# Patient Record
Sex: Female | Born: 1970 | Race: White | Hispanic: No | Marital: Married | Smoking: Never smoker
Health system: Southern US, Community
[De-identification: ages and names within clinical notes are randomized; demographics above are authoritative.]

## PROBLEM LIST (undated history)

## (undated) DIAGNOSIS — E538 Deficiency of other specified B group vitamins: Secondary | ICD-10-CM

## (undated) DIAGNOSIS — R51 Headache: Secondary | ICD-10-CM

## (undated) DIAGNOSIS — G459 Transient cerebral ischemic attack, unspecified: Secondary | ICD-10-CM

## (undated) DIAGNOSIS — Z9884 Bariatric surgery status: Secondary | ICD-10-CM

## (undated) DIAGNOSIS — E042 Nontoxic multinodular goiter: Secondary | ICD-10-CM

## (undated) DIAGNOSIS — IMO0001 Reserved for inherently not codable concepts without codable children: Secondary | ICD-10-CM

## (undated) DIAGNOSIS — D649 Anemia, unspecified: Secondary | ICD-10-CM

## (undated) DIAGNOSIS — I10 Essential (primary) hypertension: Secondary | ICD-10-CM

## (undated) DIAGNOSIS — I499 Cardiac arrhythmia, unspecified: Secondary | ICD-10-CM

## (undated) DIAGNOSIS — Z9889 Other specified postprocedural states: Secondary | ICD-10-CM

## (undated) DIAGNOSIS — R112 Nausea with vomiting, unspecified: Secondary | ICD-10-CM

## (undated) DIAGNOSIS — Z5189 Encounter for other specified aftercare: Secondary | ICD-10-CM

## (undated) DIAGNOSIS — R011 Cardiac murmur, unspecified: Secondary | ICD-10-CM

## (undated) DIAGNOSIS — M79673 Pain in unspecified foot: Secondary | ICD-10-CM

## (undated) HISTORY — DX: Pain in unspecified foot: M79.673

## (undated) HISTORY — PX: GASTRIC BYPASS: SHX52

## (undated) HISTORY — PX: CHOLECYSTECTOMY: SHX55

## (undated) HISTORY — PX: ABDOMINAL HYSTERECTOMY: SHX81

## (undated) HISTORY — PX: BREAST REDUCTION SURGERY: SHX8

## (undated) HISTORY — DX: Essential (primary) hypertension: I10

## (undated) HISTORY — DX: Deficiency of other specified B group vitamins: E53.8

## (undated) HISTORY — DX: Cardiac murmur, unspecified: R01.1

## (undated) HISTORY — DX: Transient cerebral ischemic attack, unspecified: G45.9

## (undated) HISTORY — PX: KNEE ARTHROPLASTY: SHX992

## (undated) SURGERY — ECHOCARDIOGRAM, TRANSESOPHAGEAL
Anesthesia: Moderate Sedation

---

## 1993-05-17 HISTORY — PX: REDUCTION MAMMAPLASTY: SUR839

## 2011-03-19 ENCOUNTER — Observation Stay: Payer: Self-pay | Admitting: Internal Medicine

## 2011-04-15 ENCOUNTER — Telehealth: Payer: Self-pay | Admitting: *Deleted

## 2011-04-15 NOTE — Telephone Encounter (Signed)
Set up TEE for 12/4 10am for 11am test with Dr.McLean. Case # [id number]. All instructions given to patient.

## 2011-04-15 NOTE — Telephone Encounter (Signed)
FU Call: Pt returning call from our office to schedule TEE. Please return pt call to schedule/discuss further.

## 2011-04-16 ENCOUNTER — Encounter (HOSPITAL_COMMUNITY): Payer: Self-pay | Admitting: Pharmacy Technician

## 2011-04-19 ENCOUNTER — Other Ambulatory Visit: Payer: Self-pay | Admitting: Cardiology

## 2011-04-19 MED ORDER — SODIUM CHLORIDE 0.9 % IJ SOLN: 3.0000 mL | Freq: Two times a day (BID) | INTRAMUSCULAR | Status: DC

## 2011-04-19 MED ORDER — SODIUM CHLORIDE 0.9 % IJ SOLN: 3.0000 mL | INTRAMUSCULAR | Status: DC | PRN

## 2011-04-19 MED ORDER — SODIUM CHLORIDE 0.9 % IV SOLN: 250.0000 mL | INTRAVENOUS | Status: DC | PRN

## 2011-04-20 ENCOUNTER — Ambulatory Visit (HOSPITAL_COMMUNITY)
Admission: RE | Admit: 2011-04-20 | Discharge: 2011-04-20 | Disposition: A | Discharge status: Home / Self Care | Payer: BC Managed Care – PPO | Source: Ambulatory Visit | Attending: Cardiology | Admitting: Cardiology

## 2011-04-20 ENCOUNTER — Encounter (HOSPITAL_COMMUNITY): Payer: Self-pay | Admitting: *Deleted

## 2011-04-20 ENCOUNTER — Encounter (HOSPITAL_COMMUNITY)
Admission: RE | Disposition: A | Discharge status: Home / Self Care | Payer: Self-pay | Source: Ambulatory Visit | Attending: Cardiology

## 2011-04-20 DIAGNOSIS — Q211 Atrial septal defect: Secondary | ICD-10-CM | POA: Insufficient documentation

## 2011-04-20 DIAGNOSIS — Z8673 Personal history of transient ischemic attack (TIA), and cerebral infarction without residual deficits: Secondary | ICD-10-CM | POA: Insufficient documentation

## 2011-04-20 DIAGNOSIS — Q2111 Secundum atrial septal defect: Secondary | ICD-10-CM | POA: Insufficient documentation

## 2011-04-20 DIAGNOSIS — I6789 Other cerebrovascular disease: Secondary | ICD-10-CM

## 2011-04-20 HISTORY — DX: Nontoxic multinodular goiter: E04.2

## 2011-04-20 HISTORY — DX: Headache: R51

## 2011-04-20 HISTORY — PX: TEE WITHOUT CARDIOVERSION: SHX5443

## 2011-04-20 HISTORY — DX: Encounter for other specified aftercare: Z51.89

## 2011-04-20 HISTORY — DX: Anemia, unspecified: D64.9

## 2011-04-20 HISTORY — DX: Reserved for inherently not codable concepts without codable children: IMO0001

## 2011-04-20 HISTORY — DX: Bariatric surgery status: Z98.84

## 2011-04-20 SURGERY — ECHOCARDIOGRAM, TRANSESOPHAGEAL
Anesthesia: Moderate Sedation

## 2011-04-20 MED ORDER — SODIUM CHLORIDE 0.9 % IV SOLN: Freq: Once | INTRAVENOUS | Status: DC

## 2011-04-20 MED ORDER — BUTAMBEN-TETRACAINE-BENZOCAINE 2-2-14 % EX AERO
INHALATION_SPRAY | CUTANEOUS | Status: DC | PRN
  Administered 2011-04-20: 2 via TOPICAL

## 2011-04-20 MED ORDER — BENZOCAINE 20 % MT SOLN
1.0000 "application " | OROMUCOSAL | Status: DC | PRN
  Filled 2011-04-20: qty 57

## 2011-04-20 MED ORDER — FENTANYL CITRATE 0.05 MG/ML IJ SOLN
INTRAMUSCULAR | Status: AC
  Filled 2011-04-20: qty 2

## 2011-04-20 MED ORDER — MIDAZOLAM HCL 10 MG/2ML IJ SOLN
INTRAMUSCULAR | Status: DC | PRN
  Administered 2011-04-20: 2 mg via INTRAVENOUS
  Administered 2011-04-20: 1 mg via INTRAVENOUS
  Administered 2011-04-20: 2 mg via INTRAVENOUS

## 2011-04-20 MED ORDER — MIDAZOLAM HCL 10 MG/2ML IJ SOLN
INTRAMUSCULAR | Status: AC
  Filled 2011-04-20: qty 2

## 2011-04-20 MED ORDER — SODIUM CHLORIDE 0.9 % IJ SOLN: 3.0000 mL | INTRAMUSCULAR | Status: DC | PRN

## 2011-04-20 MED ORDER — MIDAZOLAM HCL 10 MG/2ML IJ SOLN: 10.0000 mg | Freq: Once | INTRAMUSCULAR | Status: DC

## 2011-04-20 MED ORDER — FENTANYL CITRATE 0.05 MG/ML IJ SOLN: 250.0000 ug | Freq: Once | INTRAMUSCULAR | Status: DC

## 2011-04-20 MED ORDER — FENTANYL CITRATE 0.05 MG/ML IJ SOLN
INTRAMUSCULAR | Status: DC | PRN
  Administered 2011-04-20 (×3): 25 ug via INTRAVENOUS

## 2011-04-20 MED ORDER — SODIUM CHLORIDE 0.9 % IV SOLN
250.0000 mL | INTRAVENOUS | Status: DC | PRN
  Administered 2011-04-20: 500 mL via INTRAVENOUS

## 2011-04-20 NOTE — H&P (Signed)
[facility]  Patient seen and examined.  H&P from neurology reviewed.  No changes since prior note.  No further TIA-like episodes.    Marca Ancona 04/20/2011 11:01 AM

## 2011-04-20 NOTE — Procedures (Signed)
[facility]  Procedure: TEE  Indication: TIA  Sedation: Versed 7 mg IV, Fentanyl 75 mcg IV  Findings (Brief description, see TEE report for full details):  Normal LV and RV size and function.  The patient has a small PFO seen by color doppler and by bubble study.   No complications.  Alice Payne 04/20/2011 11:22 AM

## 2011-04-21 ENCOUNTER — Encounter (HOSPITAL_COMMUNITY): Payer: Self-pay | Admitting: Cardiology

## 2011-07-09 ENCOUNTER — Other Ambulatory Visit: Payer: Self-pay | Admitting: *Deleted

## 2011-07-09 DIAGNOSIS — R0602 Shortness of breath: Secondary | ICD-10-CM

## 2011-07-13 ENCOUNTER — Encounter (INDEPENDENT_AMBULATORY_CARE_PROVIDER_SITE_OTHER): Payer: BC Managed Care – PPO

## 2011-07-13 DIAGNOSIS — R0602 Shortness of breath: Secondary | ICD-10-CM

## 2011-08-03 ENCOUNTER — Other Ambulatory Visit: Payer: Self-pay | Admitting: Endocrinology

## 2011-08-03 DIAGNOSIS — E049 Nontoxic goiter, unspecified: Secondary | ICD-10-CM

## 2011-08-09 ENCOUNTER — Ambulatory Visit
Admission: RE | Admit: 2011-08-09 | Discharge: 2011-08-09 | Disposition: A | Discharge status: Home / Self Care | Payer: BC Managed Care – PPO | Source: Ambulatory Visit | Attending: Endocrinology | Admitting: Endocrinology

## 2011-08-09 DIAGNOSIS — E049 Nontoxic goiter, unspecified: Secondary | ICD-10-CM

## 2011-08-12 ENCOUNTER — Other Ambulatory Visit: Payer: Self-pay | Admitting: Endocrinology

## 2011-08-12 DIAGNOSIS — E041 Nontoxic single thyroid nodule: Secondary | ICD-10-CM

## 2011-08-18 ENCOUNTER — Ambulatory Visit
Admission: RE | Admit: 2011-08-18 | Discharge: 2011-08-18 | Disposition: A | Discharge status: Home / Self Care | Payer: BC Managed Care – PPO | Source: Ambulatory Visit | Attending: Endocrinology | Admitting: Endocrinology

## 2011-08-18 ENCOUNTER — Other Ambulatory Visit (HOSPITAL_COMMUNITY)
Admission: RE | Admit: 2011-08-18 | Discharge: 2011-08-18 | Disposition: A | Discharge status: Home / Self Care | Payer: BC Managed Care – PPO | Source: Ambulatory Visit | Attending: Interventional Radiology | Admitting: Interventional Radiology

## 2011-08-18 DIAGNOSIS — E041 Nontoxic single thyroid nodule: Secondary | ICD-10-CM

## 2011-08-18 DIAGNOSIS — E049 Nontoxic goiter, unspecified: Secondary | ICD-10-CM | POA: Insufficient documentation

## 2011-08-25 ENCOUNTER — Ambulatory Visit (INDEPENDENT_AMBULATORY_CARE_PROVIDER_SITE_OTHER): Payer: BC Managed Care – PPO | Admitting: Surgery

## 2011-09-17 ENCOUNTER — Encounter (INDEPENDENT_AMBULATORY_CARE_PROVIDER_SITE_OTHER): Payer: Self-pay | Admitting: Surgery

## 2011-09-17 ENCOUNTER — Ambulatory Visit (INDEPENDENT_AMBULATORY_CARE_PROVIDER_SITE_OTHER): Payer: BC Managed Care – PPO | Admitting: Surgery

## 2011-09-17 VITALS — BP 132/98 | HR 62 | Temp 97.3°F | Resp 12 | Ht 72.0 in | Wt 227.8 lb

## 2011-09-17 DIAGNOSIS — Z711 Person with feared health complaint in whom no diagnosis is made: Secondary | ICD-10-CM

## 2011-09-17 NOTE — Progress Notes (Signed)
Subjective:     Patient ID: Alice Payne, female   DOB: [birth year], 41 y.o.   MRN: [id number]  HPI She is referred by Dr. Riley Nearing for evaluation of possible ventral hernia. She has had multiple bowel procedures including an open cholecystectomy in a laparoscopic gastric bypass. She has noticed a fullness on both sides of her umbilicus. She has not noticed a reducible bulge. She has had no obstructive symptoms.  Review of Systems    Her review of systems negative for fever, chills, nausea, or vomiting Objective:   Physical Exam On exam she is well-developed and well-nourished. Her lungs are clear bilaterally. Cardiovascular regular rate and rhythm. Her abdomen is soft. With her both lying and standing and with Valsalva maneuvers I could not demonstrate any Ventral hernia. There is no hernia at her umbilicus or in her right upper quadrant incision.   Assessment:     No evidence of abdominal ventral hernia    Plan:     I reassured her. I will see her back as needed

## 2012-05-17 DIAGNOSIS — G459 Transient cerebral ischemic attack, unspecified: Secondary | ICD-10-CM

## 2012-05-17 HISTORY — DX: Transient cerebral ischemic attack, unspecified: G45.9

## 2012-06-10 IMAGING — US US THYROID BIOPSY
1 series · 12 of 12 positions shown · non-contrast
Comparison: none

Clinical Data/Indication: Left thyroid nodule

ULTRASOUND-GUIDED BIOPSY OF A LEFT THYROID NODULE.  FINE NEEDLE
ASPIRATION.
Procedure: The procedure, risks, benefits, and alternatives were
explained to the patient. Questions regarding the procedure were
encouraged and answered. The patient understands and consents to
the procedure.
The neck was prepped with betadine in a sterile fashion, and a
sterile drape was applied covering the operative field. A mask and
sterile gloves were used for the procedure.
Under sonographic guidance, three 25 gauge fine needle aspirates of
the dominant left lower pole nodule were obtained. Final imaging
was performed.

[Series 1: us thyroid biopsy · 0.07mm/px · 12 acquisitions, 12 frames shown]
[im 1/12]
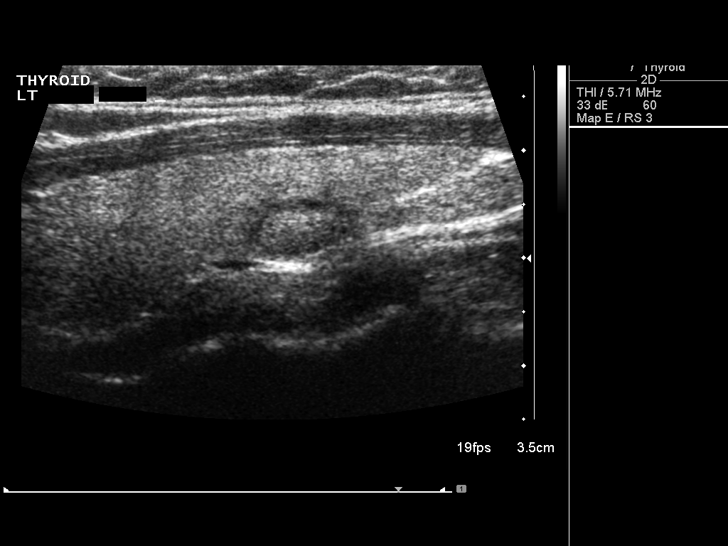
[im 2/12]
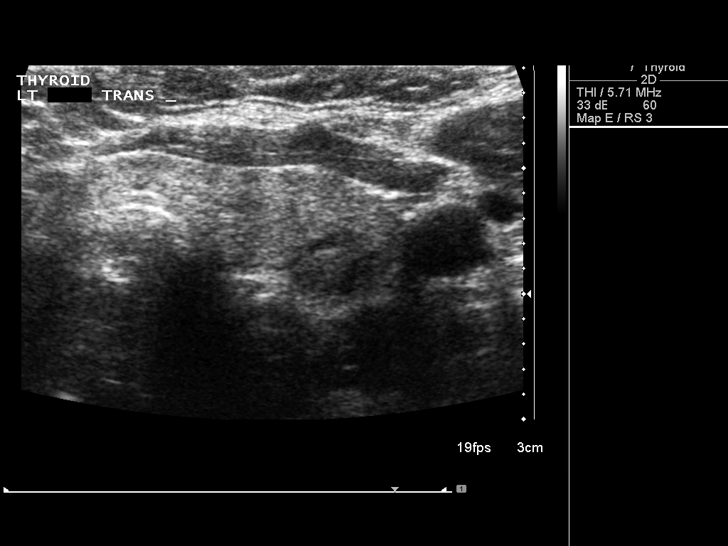
[im 3/12]
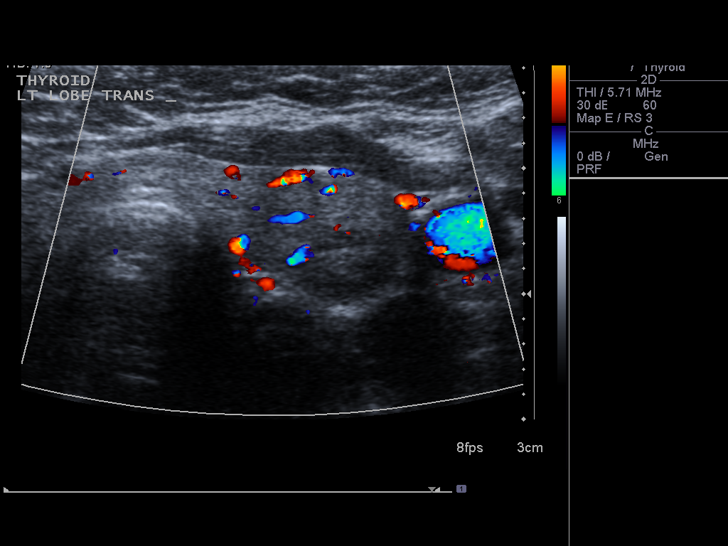
[im 4/12]
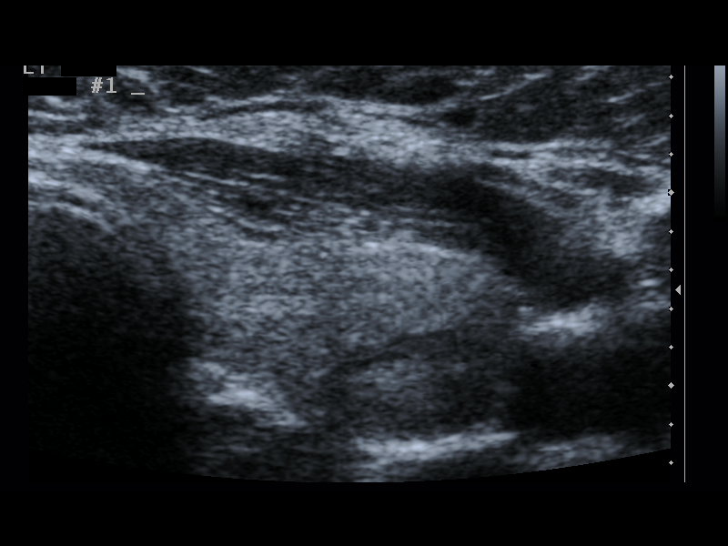
[im 5/12]
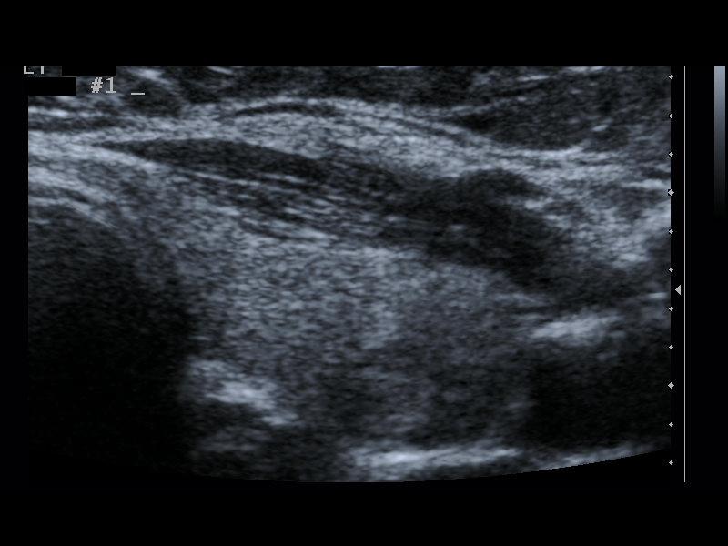
[im 6/12]
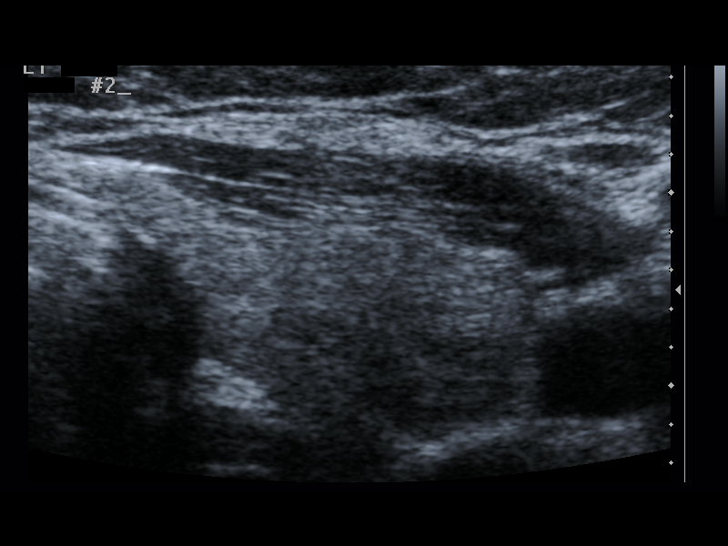
[im 7/12]
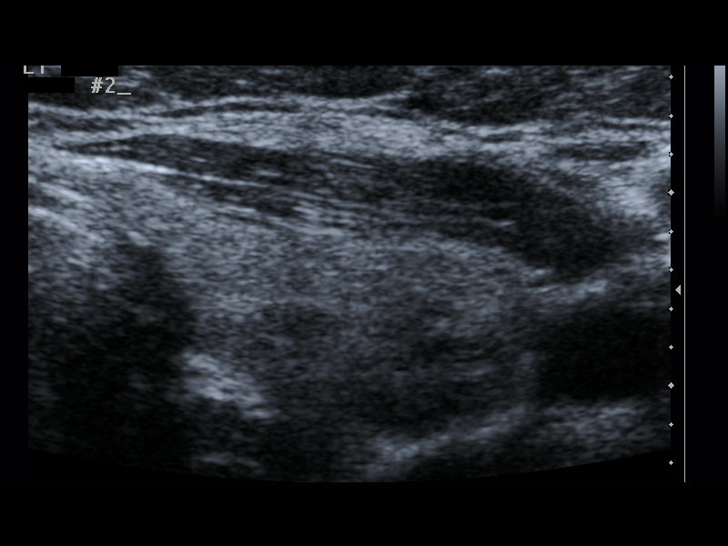
[im 8/12]
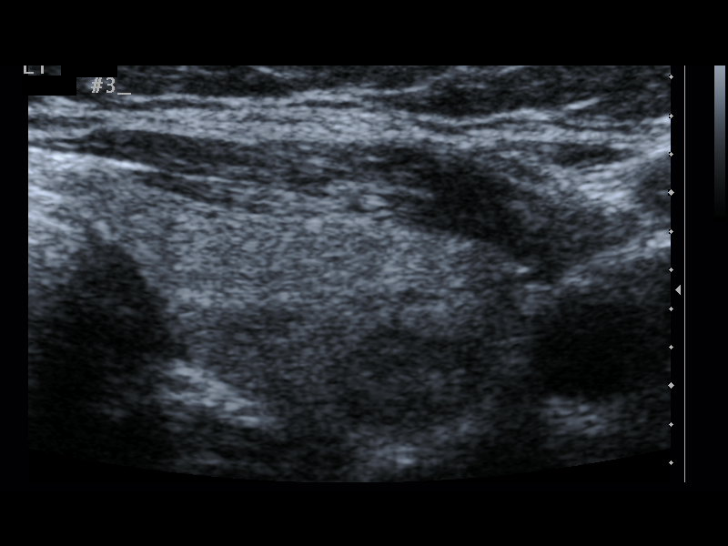
[im 9/12]
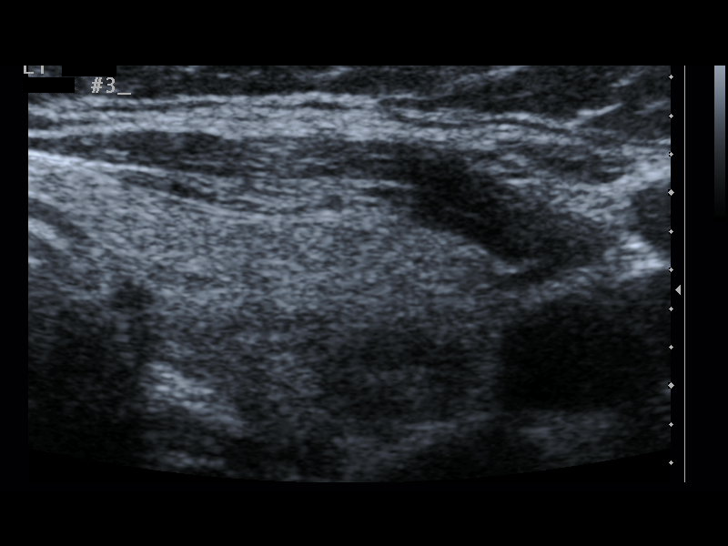
[im 10/12]
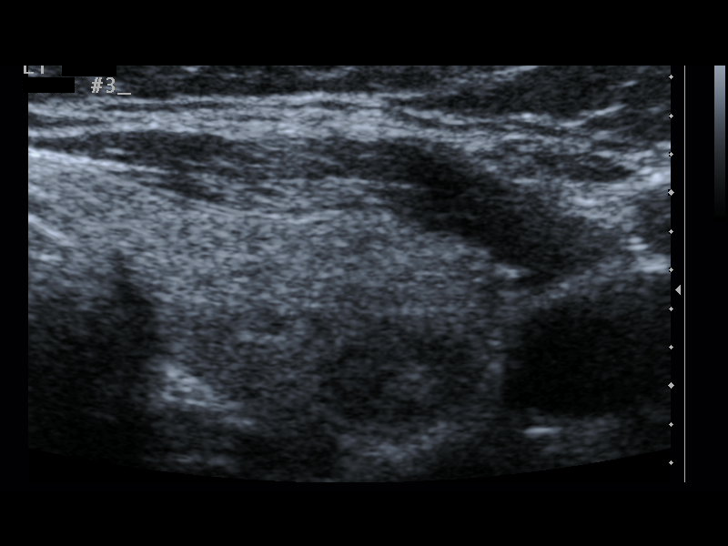
[im 11/12]
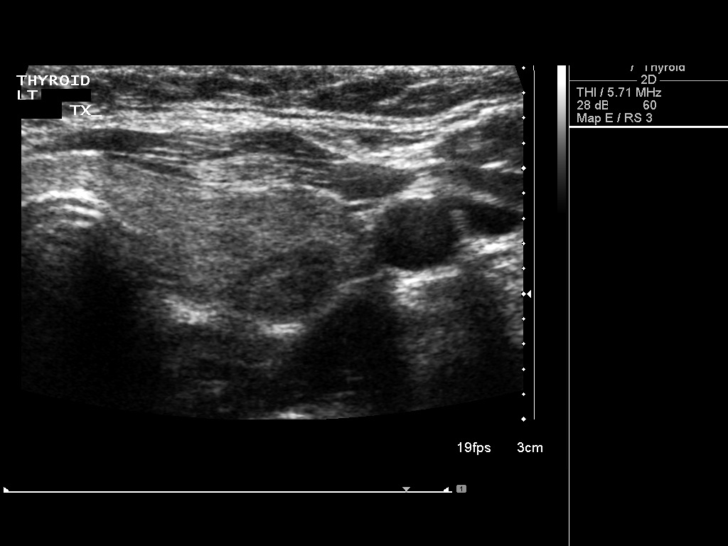
[im 12/12]
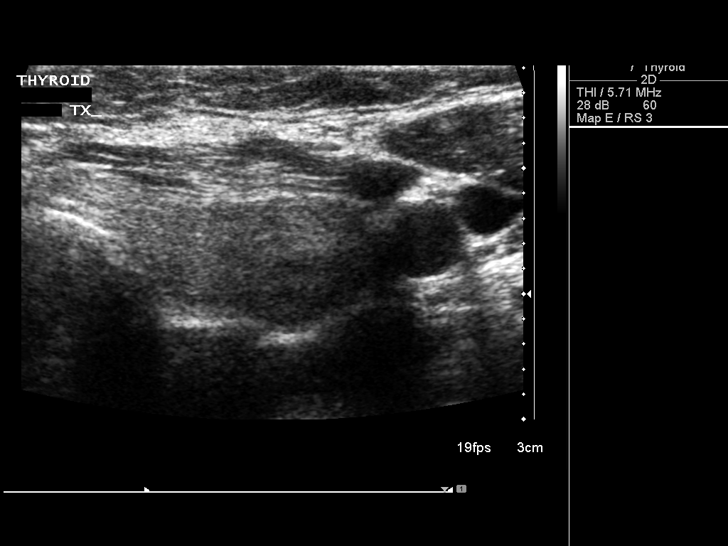

[12 of 12 positions shown; findings below may reference images not displayed]

FINDINGS: The images document guide needle placement within the
dominant left thyroid nodule. Post biopsy images demonstrate no
hemorrhage.
IMPRESSION: Successful ultrasound-guided fine needle aspiration of a dominant
left thyroid nodule.

## 2012-09-23 ENCOUNTER — Other Ambulatory Visit: Payer: Self-pay | Admitting: Diagnostic Neuroimaging

## 2012-10-18 ENCOUNTER — Other Ambulatory Visit: Payer: Self-pay | Admitting: Diagnostic Neuroimaging

## 2013-04-09 ENCOUNTER — Ambulatory Visit: Payer: Self-pay | Admitting: Orthopedic Surgery

## 2013-09-05 ENCOUNTER — Other Ambulatory Visit: Payer: Self-pay | Admitting: Endocrinology

## 2013-09-05 DIAGNOSIS — E042 Nontoxic multinodular goiter: Secondary | ICD-10-CM

## 2013-09-10 ENCOUNTER — Ambulatory Visit
Admission: RE | Admit: 2013-09-10 | Discharge: 2013-09-10 | Disposition: A | Discharge status: Home / Self Care | Payer: BC Managed Care – PPO | Source: Ambulatory Visit | Attending: Endocrinology | Admitting: Endocrinology

## 2013-09-10 DIAGNOSIS — E042 Nontoxic multinodular goiter: Secondary | ICD-10-CM

## 2013-09-13 ENCOUNTER — Other Ambulatory Visit: Payer: BC Managed Care – PPO

## 2015-02-03 ENCOUNTER — Other Ambulatory Visit (HOSPITAL_COMMUNITY): Payer: Self-pay | Admitting: Family Medicine

## 2015-02-03 ENCOUNTER — Other Ambulatory Visit: Payer: Self-pay | Admitting: Family Medicine

## 2015-02-03 DIAGNOSIS — Z1231 Encounter for screening mammogram for malignant neoplasm of breast: Secondary | ICD-10-CM

## 2015-02-05 ENCOUNTER — Ambulatory Visit (HOSPITAL_COMMUNITY): Payer: Self-pay

## 2015-02-10 ENCOUNTER — Ambulatory Visit
Admission: RE | Admit: 2015-02-10 | Discharge: 2015-02-10 | Disposition: A | Discharge status: Home / Self Care | Payer: BLUE CROSS/BLUE SHIELD | Source: Ambulatory Visit | Attending: Family Medicine | Admitting: Family Medicine

## 2015-02-10 DIAGNOSIS — Z1231 Encounter for screening mammogram for malignant neoplasm of breast: Secondary | ICD-10-CM | POA: Insufficient documentation

## 2015-04-01 ENCOUNTER — Other Ambulatory Visit: Payer: BLUE CROSS/BLUE SHIELD

## 2015-04-01 ENCOUNTER — Encounter: Payer: Self-pay | Admitting: *Deleted

## 2015-04-01 NOTE — Patient Instructions (Signed)
  Your procedure is scheduled on: 04-15-15 (TUESDAY) Report to [facility] 2ND FLOOR To find out your arrival time please call [phone] between 1PM - 3PM on 04-14-15 [facility](MONDAY)  Remember: Instructions that are not followed completely may result in serious medical risk, up to and including death, or upon the discretion of your surgeon and anesthesiologist your surgery may need to be rescheduled.    _X___ 1. Do not eat food or drink liquids after midnight. No gum chewing or hard candies.     _X___ 2. No Alcohol for 24 hours before or after surgery.   ____ 3. Bring all medications with you on the day of surgery if instructed.    _X___ 4. Notify your doctor if there is any change in your medical condition     (cold, fever, infections).     Do not wear jewelry, make-up, hairpins, clips or nail polish.  Do not wear lotions, powders, or perfumes. You may wear deodorant.  Do not shave 48 hours prior to surgery. Men may shave face and neck.  Do not bring valuables to the hospital.    [facility] is not responsible for any belongings or valuables.               Contacts, dentures or bridgework may not be worn into surgery.  Leave your suitcase in the car. After surgery it may be brought to your room.  For patients admitted to the hospital, discharge time is determined by your treatment team.   Patients discharged the day of surgery will not be allowed to drive home.   Please read over the following fact sheets that you were given:      ____ Take these medicines the morning of surgery with A SIP OF WATER:    1. NONE  2.   3.   4.  5.  6.  ____ Fleet Enema (as directed)   _X___ Use CHG Soap as directed  ____ Use inhalers on the day of surgery  ____ Stop metformin 2 days prior to surgery    ____ Take 1/2 of usual insulin dose the night before surgery and none on the morning of surgery.   ____ Stop Coumadin/Plavix/aspirin-N/A  ____ Stop  Anti-inflammatories-NO NSAIDS OR ASPIRIN PRODUCTS 7 DAYS PRIOR TO SURGERY-TYLENOL OK   ____ Stop supplements until after surgery.    ____ Bring C-Pap to the hospital.

## 2015-04-02 MED ORDER — PREGABALIN 75 MG PO CAPS
ORAL_CAPSULE | ORAL | Status: AC
  Filled 2015-04-02: qty 1

## 2015-04-02 MED ORDER — CELECOXIB 200 MG PO CAPS
ORAL_CAPSULE | ORAL | Status: AC
  Filled 2015-04-02: qty 2

## 2015-04-04 ENCOUNTER — Encounter
Admission: RE | Admit: 2015-04-04 | Discharge: 2015-04-04 | Disposition: A | Discharge status: Home / Self Care | Payer: BLUE CROSS/BLUE SHIELD | Source: Ambulatory Visit | Attending: Anesthesiology | Admitting: Anesthesiology

## 2015-04-04 DIAGNOSIS — Z01810 Encounter for preprocedural cardiovascular examination: Secondary | ICD-10-CM | POA: Diagnosis not present

## 2015-04-14 ENCOUNTER — Encounter: Payer: Self-pay | Admitting: *Deleted

## 2015-04-15 ENCOUNTER — Encounter
Admission: RE | Disposition: A | Discharge status: Home / Self Care | Payer: Self-pay | Source: Ambulatory Visit | Attending: Orthopedic Surgery

## 2015-04-15 ENCOUNTER — Ambulatory Visit
Admission: RE | Admit: 2015-04-15 | Discharge: 2015-04-15 | Disposition: A | Discharge status: Home / Self Care | Payer: BLUE CROSS/BLUE SHIELD | Source: Ambulatory Visit | Attending: Orthopedic Surgery | Admitting: Orthopedic Surgery

## 2015-04-15 ENCOUNTER — Ambulatory Visit: Payer: BLUE CROSS/BLUE SHIELD | Admitting: Anesthesiology

## 2015-04-15 ENCOUNTER — Encounter: Payer: Self-pay | Admitting: *Deleted

## 2015-04-15 DIAGNOSIS — M1712 Unilateral primary osteoarthritis, left knee: Secondary | ICD-10-CM | POA: Insufficient documentation

## 2015-04-15 DIAGNOSIS — S83242A Other tear of medial meniscus, current injury, left knee, initial encounter: Secondary | ICD-10-CM | POA: Diagnosis not present

## 2015-04-15 DIAGNOSIS — Z8249 Family history of ischemic heart disease and other diseases of the circulatory system: Secondary | ICD-10-CM | POA: Diagnosis not present

## 2015-04-15 DIAGNOSIS — Z833 Family history of diabetes mellitus: Secondary | ICD-10-CM | POA: Insufficient documentation

## 2015-04-15 DIAGNOSIS — Z800 Family history of malignant neoplasm of digestive organs: Secondary | ICD-10-CM | POA: Diagnosis not present

## 2015-04-15 DIAGNOSIS — Z841 Family history of disorders of kidney and ureter: Secondary | ICD-10-CM | POA: Diagnosis not present

## 2015-04-15 DIAGNOSIS — X58XXXA Exposure to other specified factors, initial encounter: Secondary | ICD-10-CM | POA: Diagnosis not present

## 2015-04-15 DIAGNOSIS — Z9049 Acquired absence of other specified parts of digestive tract: Secondary | ICD-10-CM | POA: Insufficient documentation

## 2015-04-15 DIAGNOSIS — S83092A Other subluxation of left patella, initial encounter: Secondary | ICD-10-CM | POA: Diagnosis not present

## 2015-04-15 DIAGNOSIS — Z886 Allergy status to analgesic agent status: Secondary | ICD-10-CM | POA: Insufficient documentation

## 2015-04-15 DIAGNOSIS — I10 Essential (primary) hypertension: Secondary | ICD-10-CM | POA: Diagnosis not present

## 2015-04-15 DIAGNOSIS — Z8673 Personal history of transient ischemic attack (TIA), and cerebral infarction without residual deficits: Secondary | ICD-10-CM | POA: Diagnosis not present

## 2015-04-15 DIAGNOSIS — Z90710 Acquired absence of both cervix and uterus: Secondary | ICD-10-CM | POA: Insufficient documentation

## 2015-04-15 DIAGNOSIS — Z9889 Other specified postprocedural states: Secondary | ICD-10-CM | POA: Diagnosis not present

## 2015-04-15 HISTORY — PX: KNEE ARTHROSCOPY: SHX127

## 2015-04-15 HISTORY — DX: Cardiac arrhythmia, unspecified: I49.9

## 2015-04-15 HISTORY — DX: Nausea with vomiting, unspecified: Z98.890

## 2015-04-15 HISTORY — DX: Nausea with vomiting, unspecified: R11.2

## 2015-04-15 SURGERY — ARTHROSCOPY, KNEE
Anesthesia: General | Site: Knee | Laterality: Left | Wound class: Clean

## 2015-04-15 MED ORDER — LIDOCAINE HCL (CARDIAC) 20 MG/ML IV SOLN
INTRAVENOUS | Status: DC | PRN
  Administered 2015-04-15: 100 mg via INTRAVENOUS

## 2015-04-15 MED ORDER — FAMOTIDINE 20 MG PO TABS
ORAL_TABLET | ORAL | Status: AC
  Administered 2015-04-15: 20 mg via ORAL
  Filled 2015-04-15: qty 1

## 2015-04-15 MED ORDER — ONDANSETRON HCL 4 MG/2ML IJ SOLN
INTRAMUSCULAR | Status: DC | PRN
  Administered 2015-04-15: 4 mg via INTRAVENOUS

## 2015-04-15 MED ORDER — HYDROCODONE-ACETAMINOPHEN 5-325 MG PO TABS
1.0000 | ORAL_TABLET | Freq: Four times a day (QID) | ORAL | Status: DC | PRN
  Administered 2015-04-15: 1 via ORAL

## 2015-04-15 MED ORDER — FENTANYL CITRATE (PF) 100 MCG/2ML IJ SOLN
25.0000 ug | INTRAMUSCULAR | Status: DC | PRN
  Administered 2015-04-15 (×4): 25 ug via INTRAVENOUS

## 2015-04-15 MED ORDER — MIDAZOLAM HCL 2 MG/2ML IJ SOLN
INTRAMUSCULAR | Status: DC | PRN
  Administered 2015-04-15: 2 mg via INTRAVENOUS

## 2015-04-15 MED ORDER — ONDANSETRON HCL 4 MG/2ML IJ SOLN
4.0000 mg | Freq: Once | INTRAMUSCULAR | Status: AC | PRN
  Administered 2015-04-15: 4 mg via INTRAVENOUS

## 2015-04-15 MED ORDER — ONDANSETRON HCL 4 MG/2ML IJ SOLN
INTRAMUSCULAR | Status: AC
  Administered 2015-04-15: 4 mg via INTRAVENOUS
  Filled 2015-04-15: qty 2

## 2015-04-15 MED ORDER — HYDROCODONE-ACETAMINOPHEN 5-325 MG PO TABS: 1.0000 | ORAL_TABLET | Freq: Four times a day (QID) | ORAL | Status: AC | PRN

## 2015-04-15 MED ORDER — FENTANYL CITRATE (PF) 100 MCG/2ML IJ SOLN
INTRAMUSCULAR | Status: AC
  Administered 2015-04-15: 25 ug via INTRAVENOUS
  Filled 2015-04-15: qty 2

## 2015-04-15 MED ORDER — ACETAMINOPHEN 10 MG/ML IV SOLN
INTRAVENOUS | Status: AC
  Filled 2015-04-15: qty 100

## 2015-04-15 MED ORDER — BUPIVACAINE-EPINEPHRINE (PF) 0.5% -1:200000 IJ SOLN
INTRAMUSCULAR | Status: AC
  Filled 2015-04-15: qty 30

## 2015-04-15 MED ORDER — FENTANYL CITRATE (PF) 100 MCG/2ML IJ SOLN
INTRAMUSCULAR | Status: DC | PRN
  Administered 2015-04-15: 100 ug via INTRAVENOUS

## 2015-04-15 MED ORDER — DEXAMETHASONE SODIUM PHOSPHATE 4 MG/ML IJ SOLN
INTRAMUSCULAR | Status: DC | PRN
  Administered 2015-04-15: 10 mg via INTRAVENOUS

## 2015-04-15 MED ORDER — FAMOTIDINE 20 MG PO TABS
20.0000 mg | ORAL_TABLET | Freq: Once | ORAL | Status: AC
  Administered 2015-04-15: 20 mg via ORAL

## 2015-04-15 MED ORDER — LACTATED RINGERS IV SOLN
INTRAVENOUS | Status: DC
  Administered 2015-04-15: 15:00:00 via INTRAVENOUS

## 2015-04-15 MED ORDER — HYDROCODONE-ACETAMINOPHEN 5-325 MG PO TABS
ORAL_TABLET | ORAL | Status: AC
  Filled 2015-04-15: qty 1

## 2015-04-15 MED ORDER — PROPOFOL 10 MG/ML IV BOLUS
INTRAVENOUS | Status: DC | PRN
  Administered 2015-04-15: 180 mg via INTRAVENOUS

## 2015-04-15 MED ORDER — ACETAMINOPHEN 10 MG/ML IV SOLN
INTRAVENOUS | Status: DC | PRN
  Administered 2015-04-15: 1000 mg via INTRAVENOUS

## 2015-04-15 SURGICAL SUPPLY — 28 items
BANDAGE ACE 4X5 VEL STRL LF (GAUZE/BANDAGES/DRESSINGS) ×3 IMPLANT
BANDAGE ELASTIC 4 LF NS (GAUZE/BANDAGES/DRESSINGS) ×3 IMPLANT
BLADE FULL RADIUS 3.5 (BLADE) ×3 IMPLANT
BLADE INCISOR PLUS 4.5 (BLADE) ×3 IMPLANT
BLADE SHAVER 4.5 DBL SERAT CV (CUTTER) ×3 IMPLANT
BLADE SHAVER 4.5X7 STR FR (MISCELLANEOUS) ×3 IMPLANT
CHLORAPREP W/TINT 26ML (MISCELLANEOUS) ×3 IMPLANT
CUTTER AGGRESSIVE+ 3.5 (CUTTER) ×3 IMPLANT
GAUZE PETRO XEROFOAM 1X8 (MISCELLANEOUS) ×3 IMPLANT
GAUZE SPONGE 4X4 12PLY STRL (GAUZE/BANDAGES/DRESSINGS) ×3 IMPLANT
GLOVE BIOGEL PI IND STRL 9 (GLOVE) ×1 IMPLANT
GLOVE BIOGEL PI INDICATOR 9 (GLOVE) ×2
GLOVE SURG ORTHO 9.0 STRL STRW (GLOVE) ×3 IMPLANT
GOWN SPECIALTY ULTRA XL (MISCELLANEOUS) ×3 IMPLANT
GOWN STRL REUS W/ TWL LRG LVL3 (GOWN DISPOSABLE) ×1 IMPLANT
GOWN STRL REUS W/TWL LRG LVL3 (GOWN DISPOSABLE) ×2
IV LACTATED RINGER IRRG 3000ML (IV SOLUTION) ×8
IV LR IRRIG 3000ML ARTHROMATIC (IV SOLUTION) ×4 IMPLANT
KIT RM TURNOVER STRD PROC AR (KITS) ×3 IMPLANT
MANIFOLD NEPTUNE II (INSTRUMENTS) ×3 IMPLANT
PACK ARTHROSCOPY KNEE (MISCELLANEOUS) ×3 IMPLANT
SET TUBE SUCT SHAVER OUTFL 24K (TUBING) ×3 IMPLANT
SET TUBE TIP INTRA-ARTICULAR (MISCELLANEOUS) ×3 IMPLANT
SUT ETHILON 4-0 (SUTURE) ×2
SUT ETHILON 4-0 FS2 18XMFL BLK (SUTURE) ×1
SUTURE ETHLN 4-0 FS2 18XMF BLK (SUTURE) ×1 IMPLANT
TUBING ARTHRO INFLOW-ONLY STRL (TUBING) ×3 IMPLANT
WAND HAND CNTRL MULTIVAC 50 (MISCELLANEOUS) ×3 IMPLANT

## 2015-04-15 NOTE — Transfer of Care (Signed)
Immediate Anesthesia Transfer of Care Note  Patient: Alice Payne  Procedure(s) Performed: Procedure(s): ARTHROSCOPY KNEE, PARTIAL MEDIAL MENISECTOMY, LATERAL RELEASE (Left)  Patient Location: PACU  Anesthesia Type:General  Level of Consciousness: awake, oriented and sedated  Airway & Oxygen Therapy: Patient Spontanous Breathing and Patient connected to face mask oxygen  Post-op Assessment: Report given to RN and Post -op Vital signs reviewed and stable  Post vital signs: Reviewed and stable  Last Vitals:  Filed Vitals:   04/15/15 1243  BP: 142/85  Pulse: 62  Temp: 36.7 C  Resp: 16    Complications: No apparent anesthesia complications

## 2015-04-15 NOTE — Anesthesia Preprocedure Evaluation (Signed)
Anesthesia Evaluation  Patient identified by MRN, date of birth, ID band Patient awake    Reviewed: Allergy & Precautions, NPO status , Patient's Chart, lab work & pertinent test results  History of Anesthesia Complications (+) PONV  Airway Mallampati: II       Dental no notable dental hx. (+) Teeth Intact   Pulmonary neg pulmonary ROS,    breath sounds clear to auscultation       Cardiovascular Exercise Tolerance: Good hypertension, Pt. on medications + dysrhythmias  Rhythm:Regular Rate:Normal     Neuro/Psych  Headaches, TIA   GI/Hepatic negative GI ROS, Neg liver ROS,   Endo/Other  negative endocrine ROS  Renal/GU negative Renal ROS     Musculoskeletal   Abdominal Normal abdominal exam  (+)   Peds  Hematology  (+) anemia ,   Anesthesia Other Findings   Reproductive/Obstetrics                             Anesthesia Physical Anesthesia Plan  ASA: II  Anesthesia Plan: General   Post-op Pain Management:    Induction: Intravenous  Airway Management Planned: LMA  Additional Equipment:   Intra-op Plan:   Post-operative Plan:   Informed Consent: I have reviewed the patients History and Physical, chart, labs and discussed the procedure including the risks, benefits and alternatives for the proposed anesthesia with the patient or authorized representative who has indicated his/her understanding and acceptance.     Plan Discussed with: CRNA  Anesthesia Plan Comments:         Anesthesia Quick Evaluation

## 2015-04-15 NOTE — H&P (Signed)
Reviewed paper H+P, will be scanned into chart. No changes noted.  

## 2015-04-15 NOTE — Op Note (Signed)
04/15/2015  3:44 PM  PATIENT:  Alice Payne  44 y.o. female  PRE-OPERATIVE DIAGNOSIS:  MEDIAL MENISCUS TEAR  POST-OPERATIVE DIAGNOSIS:  MEDIAL MENISCUS TEAR, ARTHRITIS, plica and patellar subluxation  PROCEDURE:  Procedure(s): ARTHROSCOPY KNEE, PARTIAL MEDIAL MENISECTOMY, LATERAL RELEASE (Left) excision plica  SURGEON: Leitha SchullerMichael J Torii Royse, MD  ASSISTANTS: None  ANESTHESIA:   general  EBL:     BLOOD ADMINISTERED:none  DRAINS: none   LOCAL MEDICATIONS USED:  MARCAINE     SPECIMEN:  No Specimen  DISPOSITION OF SPECIMEN:  N/A  COUNTS:  YES  TOURNIQUET:   none  IMPLANTS: None  DICTATION: .Dragon Dictation patient was brought the operating room and after adequate general anesthesia was obtained, the left leg was prepped draped in sterile fashion with a arthroscopic leg holder and tourniquet applied. After patient identification and timeout procedures were completed, inferior lateral portal made and the scope was introduced initial inspection revealed patellar chondromalacia particularly lateral facet with a tight lateral retinaculum minutes mild patellar subluxation around medially and medial plica band was identified and appeared to impinge on the patellofemoral joint this was subsequently ablated with use of an ArthroCare wand, the medial compartment inferior medial portal made and on probing there is a meniscus tear consistent with MRI findings of the posterior horn involving most the posterior third this was debrided with use of a meniscal punch and ArthriCare wand to a stable margin is also significant areas of superficial cartilage loss in both femoral and tibial condyles going to the notch the anterior cruciate ligament was intact going to the lateral compartment there were loose pieces of articular cartilage floating within the lateral compartment the meniscus was intact to probing there was a large area approximately 2 x 3 cm over the femoral condyle with fissuring down to the bone  and partial thickness cartilage loss the gutters were free of any loose bodies. At this point the meniscus tear medially was addressed and the knee was thoroughly irrigated remove these loose fragments of articular cartilage. The plica band was ablated with use of the ArthroCare wand and a lateral release was performed which appeared to give parapatellar alignment and decrease pressure to the lateral facet after thorough irrigation of the joint argentation was withdrawn wounds were closed with simple 4-0 nylon 30 cc half percent Sensorcaine with epinephrine infiltrated the portals as well as near the lateral release Xeroform 4 x 4's web roll and Ace wrap applied   PLAN OF CARE: Discharge to home after PACU  PATIENT DISPOSITION:  PACU - hemodynamically stable.

## 2015-04-15 NOTE — Anesthesia Procedure Notes (Signed)
Procedure Name: LMA Insertion Date/Time: 04/15/2015 3:08 PM Performed by: Junious SilkNOLES, Elbert Polyakov Pre-anesthesia Checklist: Patient identified, Patient being monitored, Timeout performed, Emergency Drugs available and Suction available Patient Re-evaluated:Patient Re-evaluated prior to inductionOxygen Delivery Method: Circle system utilized Preoxygenation: Pre-oxygenation with 100% oxygen Intubation Type: IV induction Ventilation: Mask ventilation without difficulty LMA: LMA inserted LMA Size: 3.5 Tube type: Oral Number of attempts: 1 Placement Confirmation: positive ETCO2 and breath sounds checked- equal and bilateral Tube secured with: Tape Dental Injury: Teeth and Oropharynx as per pre-operative assessment

## 2015-04-15 NOTE — Discharge Instructions (Addendum)
Weightbearing as tolerated on left leg. Keep dressing dry and clean       AMBULATORY SURGERY  DISCHARGE INSTRUCTIONS   1) The drugs that you were given will stay in your system until tomorrow so for the next 24 hours you should not:  A) Drive an automobile B) Make any legal decisions C) Drink any alcoholic beverage   2) You may resume regular meals tomorrow.  Today it is better to start with liquids and gradually work up to solid foods.  You may eat anything you prefer, but it is better to start with liquids, then soup and crackers, and gradually work up to solid foods.   3) Please notify your doctor immediately if you have any unusual bleeding, trouble breathing, redness and pain at the surgery site, drainage, fever, or pain not relieved by medication. 4)   5) Your post-operative visit with Dr.                                     is: Date:                        Time:    Please call to schedule your post-operative visit.  6) Additional Instructions:

## 2015-04-16 ENCOUNTER — Encounter: Payer: Self-pay | Admitting: Orthopedic Surgery

## 2015-04-17 NOTE — Anesthesia Postprocedure Evaluation (Signed)
Anesthesia Post Note  Patient: Alice Payne  Procedure(s) Performed: Procedure(s) (LRB): ARTHROSCOPY KNEE, PARTIAL MEDIAL MENISECTOMY, LATERAL RELEASE (Left)  Patient location during evaluation: PACU Anesthesia Type: General Level of consciousness: awake Pain management: pain level controlled Vital Signs Assessment: post-procedure vital signs reviewed and stable Respiratory status: spontaneous breathing Cardiovascular status: stable    Last Vitals:  Filed Vitals:   04/15/15 1653 04/15/15 1713  BP: 150/72 133/70  Pulse: 59 58  Temp: 36.7 C   Resp: 16 16    Last Pain:  Filed Vitals:   04/15/15 1716  PainSc: 4                  VAN STAVEREN,Hayley Horn

## 2015-04-28 ENCOUNTER — Other Ambulatory Visit: Payer: Self-pay | Admitting: Orthopedic Surgery

## 2015-04-28 ENCOUNTER — Ambulatory Visit
Admission: RE | Admit: 2015-04-28 | Discharge: 2015-04-28 | Disposition: A | Discharge status: Home / Self Care | Payer: BLUE CROSS/BLUE SHIELD | Source: Ambulatory Visit | Attending: Orthopedic Surgery | Admitting: Orthopedic Surgery

## 2015-04-28 DIAGNOSIS — M7989 Other specified soft tissue disorders: Secondary | ICD-10-CM

## 2015-12-24 ENCOUNTER — Other Ambulatory Visit: Payer: Self-pay | Admitting: Family Medicine

## 2016-01-07 ENCOUNTER — Other Ambulatory Visit: Payer: Self-pay | Admitting: Family Medicine

## 2016-01-07 DIAGNOSIS — Z1231 Encounter for screening mammogram for malignant neoplasm of breast: Secondary | ICD-10-CM

## 2016-01-14 ENCOUNTER — Other Ambulatory Visit: Payer: Self-pay | Admitting: Family Medicine

## 2016-01-14 DIAGNOSIS — N6320 Unspecified lump in the left breast, unspecified quadrant: Secondary | ICD-10-CM

## 2016-01-22 ENCOUNTER — Other Ambulatory Visit: Payer: Self-pay | Admitting: Nurse Practitioner

## 2016-01-22 DIAGNOSIS — N6320 Unspecified lump in the left breast, unspecified quadrant: Secondary | ICD-10-CM

## 2016-02-12 ENCOUNTER — Ambulatory Visit: Payer: BLUE CROSS/BLUE SHIELD

## 2017-08-18 ENCOUNTER — Emergency Department: Payer: BLUE CROSS/BLUE SHIELD

## 2017-08-18 ENCOUNTER — Encounter: Payer: Self-pay | Admitting: Emergency Medicine

## 2017-08-18 ENCOUNTER — Emergency Department
Admission: EM | Admit: 2017-08-18 | Discharge: 2017-08-18 | Disposition: A | Discharge status: Home / Self Care | Payer: BLUE CROSS/BLUE SHIELD | Attending: Emergency Medicine | Admitting: Emergency Medicine

## 2017-08-18 ENCOUNTER — Other Ambulatory Visit: Payer: Self-pay

## 2017-08-18 DIAGNOSIS — R1012 Left upper quadrant pain: Secondary | ICD-10-CM | POA: Diagnosis present

## 2017-08-18 DIAGNOSIS — Z96651 Presence of right artificial knee joint: Secondary | ICD-10-CM | POA: Insufficient documentation

## 2017-08-18 DIAGNOSIS — I10 Essential (primary) hypertension: Secondary | ICD-10-CM | POA: Insufficient documentation

## 2017-08-18 DIAGNOSIS — Z7902 Long term (current) use of antithrombotics/antiplatelets: Secondary | ICD-10-CM | POA: Diagnosis not present

## 2017-08-18 LAB — CBC WITH DIFFERENTIAL/PLATELET
Basophils Absolute: 0 10*3/uL (ref 0–0.1)
Basophils Relative: 0 %
EOS ABS: 0 10*3/uL (ref 0–0.7)
Eosinophils Relative: 0 %
HCT: 46 % (ref 35.0–47.0)
HEMOGLOBIN: 15.5 g/dL (ref 12.0–16.0)
LYMPHS PCT: 6 %
Lymphs Abs: 0.6 10*3/uL — ABNORMAL LOW (ref 1.0–3.6)
MCH: 30.5 pg (ref 26.0–34.0)
MCHC: 33.8 g/dL (ref 32.0–36.0)
MCV: 90.3 fL (ref 80.0–100.0)
Monocytes Absolute: 0.8 10*3/uL (ref 0.2–0.9)
Monocytes Relative: 8 %
NEUTROS PCT: 86 %
Neutro Abs: 8.9 10*3/uL — ABNORMAL HIGH (ref 1.4–6.5)
Platelets: 276 10*3/uL (ref 150–440)
RBC: 5.09 MIL/uL (ref 3.80–5.20)
RDW: 13.6 % (ref 11.5–14.5)
WBC: 10.4 10*3/uL (ref 3.6–11.0)

## 2017-08-18 LAB — COMPREHENSIVE METABOLIC PANEL
ALK PHOS: 54 U/L (ref 38–126)
ALT: 16 U/L (ref 14–54)
AST: 18 U/L (ref 15–41)
Albumin: 4.1 g/dL (ref 3.5–5.0)
Anion gap: 13 (ref 5–15)
BUN: 9 mg/dL (ref 6–20)
CALCIUM: 9 mg/dL (ref 8.9–10.3)
CO2: 20 mmol/L — AB (ref 22–32)
CREATININE: 0.84 mg/dL (ref 0.44–1.00)
Chloride: 109 mmol/L (ref 101–111)
GFR calc non Af Amer: >60 mL/min (ref >60)
GLUCOSE: 159 mg/dL — AB (ref 65–99)
Potassium: 3.6 mmol/L (ref 3.5–5.1)
SODIUM: 142 mmol/L (ref 135–145)
Total Bilirubin: 0.9 mg/dL (ref 0.3–1.2)
Total Protein: 7.4 g/dL (ref 6.5–8.1)

## 2017-08-18 LAB — LIPASE, BLOOD: Lipase: 20 U/L (ref 11–51)

## 2017-08-18 MED ORDER — MORPHINE SULFATE (PF) 4 MG/ML IV SOLN
4.0000 mg | Freq: Once | INTRAVENOUS | Status: AC
  Administered 2017-08-18: 4 mg via INTRAVENOUS
  Filled 2017-08-18: qty 1

## 2017-08-18 MED ORDER — ONDANSETRON 4 MG PO TBDP: 4.0000 mg | ORAL_TABLET | Freq: Once | ORAL | Status: DC

## 2017-08-18 MED ORDER — PANTOPRAZOLE SODIUM 40 MG PO TBEC: 40.0000 mg | DELAYED_RELEASE_TABLET | Freq: Every day | ORAL | 1 refills | Status: AC

## 2017-08-18 MED ORDER — ONDANSETRON HCL 4 MG/2ML IJ SOLN
4.0000 mg | Freq: Once | INTRAMUSCULAR | Status: AC
  Administered 2017-08-18: 4 mg via INTRAVENOUS
  Filled 2017-08-18: qty 2

## 2017-08-18 MED ORDER — ONDANSETRON 4 MG PO TBDP
4.0000 mg | ORAL_TABLET | Freq: Once | ORAL | Status: AC
  Administered 2017-08-18: 4 mg via ORAL

## 2017-08-18 MED ORDER — IOHEXOL 300 MG/ML  SOLN
100.0000 mL | Freq: Once | INTRAMUSCULAR | Status: AC | PRN
  Administered 2017-08-18: 100 mL via INTRAVENOUS

## 2017-08-18 MED ORDER — SODIUM CHLORIDE 0.9 % IV BOLUS
1000.0000 mL | Freq: Once | INTRAVENOUS | Status: AC
  Administered 2017-08-18: 1000 mL via INTRAVENOUS

## 2017-08-18 MED ORDER — ONDANSETRON 4 MG PO TBDP: 4.0000 mg | ORAL_TABLET | Freq: Four times a day (QID) | ORAL | 0 refills | Status: AC | PRN

## 2017-08-18 MED ORDER — OXYCODONE-ACETAMINOPHEN 5-325 MG PO TABS: 1.0000 | ORAL_TABLET | Freq: Three times a day (TID) | ORAL | 0 refills | Status: AC | PRN

## 2017-08-18 MED ORDER — IOPAMIDOL (ISOVUE-300) INJECTION 61%
30.0000 mL | Freq: Once | INTRAVENOUS | Status: AC | PRN
Start: 2017-08-18 — End: 2017-08-18
  Administered 2017-08-18: 30 mL via ORAL

## 2017-08-18 MED ORDER — ONDANSETRON 4 MG PO TBDP
ORAL_TABLET | ORAL | Status: AC
  Filled 2017-08-18: qty 1

## 2017-08-18 MED ORDER — ONDANSETRON 4 MG PO TBDP: 4.0000 mg | ORAL_TABLET | Freq: Three times a day (TID) | ORAL | 0 refills | Status: AC | PRN

## 2017-08-18 MED ORDER — SUCRALFATE 1 G PO TABS: 1.0000 g | ORAL_TABLET | Freq: Four times a day (QID) | ORAL | 1 refills | Status: AC

## 2017-08-18 NOTE — ED Notes (Signed)
Patient transported to X-ray 

## 2017-08-18 NOTE — ED Notes (Signed)
esignature pad not working, pt verbalized understanding of discharge and follow up instructions

## 2017-08-18 NOTE — Discharge Instructions (Addendum)
Please start taking an over the counter acid reducer (such as nexium or prevacid) as directed on the packaging. Follow-up closely with your primary care doctor and with gastroenterology.   You were seen in the emergency room for abdominal pain. It is important that you follow up closely with your primary care doctor in the next couple of days.  If you're unable to see her primary care doctor you may return to the emergency room or go to the [facility] walk-in clinic in 1 or 2 days for reexam.  Please return to the emergency room right away if you are to develop a fever, severe nausea, your pain becomes severe or worsens, you are unable to keep food down, begin vomiting any dark or bloody fluid, you develop any dark or bloody stools, feel dehydrated, or other new concerns or symptoms arise.

## 2017-08-18 NOTE — ED Triage Notes (Signed)
Pt to triage via w/c, appears uncomfortable; st generalized abd pain and dizziness since 3pm yesterday; denies hx of same

## 2017-08-18 NOTE — ED Provider Notes (Addendum)
CT IMPRESSION: 1. Several mildly dilated small bowel loops with air-fluid levels throughout the abdomen. Oral contrast transits to the distal small bowel. No focal small bowel caliber transition. No bowel wall thickening. Findings are favored to represent a mild adynamic ileus. A resolving partial small bowel obstruction could have a similar appearance. 2. Small hiatal hernia. Otherwise uncomplicated postsurgical changes from gastric bypass surgery. 3. Small volume simple ascites. 4.  Aortic Atherosclerosis (ICD10-I70.0).    No clear etiology for the patient's pain.  Possible ileus although I suspect she may have an underlying acid problem or ulcer forming.  I will place her on antacids as well as medicines for pain and nausea.  I will refer her to GI for close outpatient follow-up.  Emily FilbertWilliams, Milani Lowenstein E, MD 08/18/17 [id number]    Emily FilbertWilliams, Chizuko Trine E, MD 08/18/17 [id number]

## 2017-08-18 NOTE — ED Notes (Signed)
abd pain and nausea since 1500 4/3

## 2017-08-18 NOTE — ED Provider Notes (Signed)
[facility] Emergency Department Provider Note   ____________________________________________   First MD Initiated Contact with Patient 08/18/17 0423     (approximate)  I have reviewed the triage vital signs and the nursing notes.   HISTORY  Chief Complaint Abdominal Pain    HPI Alice Payne is a 47 y.o. female reports a previous history of hysterectomy, cholecystectomy, and gastric bypass  Patient reports that about noon today she began feeling nauseated and lost her appetite.  She laid down for a little while in about 3 PM she woke up with abdominal pain.  She reports a severe pain in her abdomen, is primarily located in the middle of the abdomen, but hard to locate.  She reports is aching constant pain, associated with a feeling as though she is constipated.  She has not been able to have a bowel movement since this morning.  She also reports she is been belching frequently.  Nausea but no vomiting.  No fevers or chills but did feel like she was having sort of cold sweats when she was on the toilet about 3 PM.  Previous hysterectomy.  Reports the pain is mostly in the middle and some upper may be left upper abdomen.  No urinary symptoms.   Past Medical History:  Diagnosis Date  . Anemia    H/O PRIOR TO HYSTERECTOMY  . Blood transfusion   . Dysrhythmia    PVC'S  . Foot pain   . Gastric bypass status for obesity    2004  . Headache(784.0)    migraines  . Heart murmur    NO MEDS-ASYMPTOMATIC  . Hypertension    BEEN OFF MEDS SINCE JAN 2016  . Multiple thyroid nodules    goiter  . PONV (postoperative nausea and vomiting)   . Transient ischemic attack 2014  . Vitamin B 12 deficiency   . Vitamin D deficiency     There are no active problems to display for this patient.   Past Surgical History:  Procedure Laterality Date  . ABDOMINAL HYSTERECTOMY    . BREAST REDUCTION SURGERY    . CHOLECYSTECTOMY    . GASTRIC BYPASS    . KNEE  ARTHROPLASTY     right  . KNEE ARTHROSCOPY Left 04/15/2015   Procedure: ARTHROSCOPY KNEE, PARTIAL MEDIAL MENISECTOMY, LATERAL RELEASE;  Surgeon: Kennedy Bucker, MD;  Location: [facility] ORS;  Service: Orthopedics;  Laterality: Left;  . REDUCTION MAMMAPLASTY Bilateral 1995  . TEE WITHOUT CARDIOVERSION  04/20/2011   Procedure: TRANSESOPHAGEAL ECHOCARDIOGRAM (TEE);  Surgeon: Marca Ancona, MD;  Location: [facility] ENDOSCOPY;  Service: Cardiovascular;  Laterality: N/A;    Prior to Admission medications   Medication Sig Start Date End Date Taking? Authorizing Provider  acetaminophen (TYLENOL) 500 MG tablet Take 500 mg by mouth every 6 (six) hours as needed.    [provider]  clopidogrel (PLAVIX) 75 MG tablet TAKE 1 TABLET DAILY (PLEASE SCHEDULE APPOINTMENT) 09/23/12   Penumalli, Glenford Bayley, MD  HYDROcodone-acetaminophen (NORCO) 5-325 MG tablet Take 1 tablet by mouth every 6 (six) hours as needed for moderate pain. 04/15/15   Kennedy Bucker, MD  ondansetron (ZOFRAN ODT) 4 MG disintegrating tablet Take 1 tablet (4 mg total) by mouth every 6 (six) hours as needed for nausea or vomiting. 08/18/17   Sharyn Creamer, MD  Vitamin D, Ergocalciferol, (DRISDOL) 50000 UNITS CAPS capsule Take 50,000 Units by mouth every 7 (seven) days.    [provider]    Allergies Aspirin; Demerol [meperidine]; and Wine [alcohol]  Family History  Problem Relation Age of Onset  . Cancer Maternal Uncle        brain  . Cancer Paternal Grandmother        thyroid/bone  . Breast cancer Paternal Grandmother   . Cancer Paternal Grandfather        breast    Social History Social History   Tobacco Use  . Smoking status: Never Smoker  . Smokeless tobacco: Never Used  Substance Use Topics  . Alcohol use: Yes    Comment: OCC  . Drug use: No    Review of Systems Constitutional: No fever/chills Eyes: No visual changes. ENT: No sore throat. Cardiovascular: Denies chest pain. Respiratory: Denies shortness of  breath. Gastrointestinal: No vomiting.  No diarrhea.   Genitourinary: Negative for dysuria. Musculoskeletal: Negative for back pain. Skin: Negative for rash. Neurological: Negative for headaches, focal weakness or numbness.    ____________________________________________   PHYSICAL EXAM:  VITAL SIGNS: ED Triage Vitals  Enc Vitals Group     BP 08/18/17 0431 130/77     Pulse Rate 08/18/17 0431 78     Resp 08/18/17 0431 18     Temp 08/18/17 0431 (!) 97.3 F (36.3 C)     Temp Source 08/18/17 0431 Oral     SpO2 08/18/17 0431 95 %     Weight 08/18/17 0417 210 lb (95.3 kg)     Height 08/18/17 0417 6' (1.829 m)     Head Circumference --      Peak Flow --      Pain Score 08/18/17 0416 9     Pain Loc --      Pain Edu? --      Excl. in GC? --     Constitutional: Alert and oriented. Well appearing and in no acute distress she is holding her hand across her abdomen appears to be having abdominal pain. Eyes: Conjunctivae are normal. Head: Atraumatic. Nose: No congestion/rhinnorhea. Mouth/Throat: Mucous membranes are somewhat dry. Neck: No stridor.   Cardiovascular: Normal rate, regular rhythm. Grossly normal heart sounds.  Good peripheral circulation. Respiratory: Normal respiratory effort.  No retractions. Lungs CTAB. Gastrointestinal: Soft and moderately tender throughout the epigastrium and left upper quadrant, denies pain in the lower abdomen.  There is no rebound or guarding discomfort.  No pain to percussion.. No distention. Musculoskeletal: No lower extremity tenderness nor edema. Neurologic:  Normal speech and language. No gross focal neurologic deficits are appreciated.  Skin:  Skin is warm, dry and intact. No rash noted. Psychiatric: Mood and affect are normal. Speech and behavior are normal.  ____________________________________________   LABS (all labs ordered are listed, but only abnormal results are displayed)  Labs Reviewed  CBC WITH DIFFERENTIAL/PLATELET -  Abnormal; Notable for the following components:      Result Value   Neutro Abs 8.9 (*)    Lymphs Abs 0.6 (*)    All other components within normal limits  COMPREHENSIVE METABOLIC PANEL - Abnormal; Notable for the following components:   CO2 20 (*)    Glucose, Bld 159 (*)    All other components within normal limits  LIPASE, BLOOD  URINALYSIS, COMPLETE (UACMP) WITH MICROSCOPIC   ____________________________________________  EKG  Reviewed and interpreted by me at 4:30 AM Despite a couple attempts, fair amount of variation in the baseline tracing Heart rate 80 QRS 110 QTC 450 Normal sinus rhythm, no evidence of ischemia or ectopy. ____________________________________________  RADIOLOGY  CT abd/plv pending at sign out.  Dr. Mayford Knife will follow-up.  ____________________________________________   PROCEDURES  Procedure(s) performed: None  Procedures  Critical Care performed: No  ____________________________________________   INITIAL IMPRESSION / ASSESSMENT AND PLAN / ED COURSE  Pertinent labs & imaging results that were available during my care of the patient were reviewed by me and considered in my medical decision making (see chart for details).  Differential diagnosis includes but is not limited to, abdominal perforation, aortic dissection, cholecystitis, appendicitis, diverticulitis, colitis, esophagitis/gastritis, kidney stone, pyelonephritis, urinary tract infection, aortic aneurysm. All are considered in decision and treatment plan. Based upon the patient's presentation and risk factors, concerned about patient's abdominal pain.  Concern given the left upper quadrant epigastric nature for possible causes such as peptic ulcer disease, perforation, bowel obstruction given her associated belching and feeling of constipation, possible complication from her remote gastric bypass, or other acute intra-abdominal process.  Report of CT abdomen and pelvis, pain medication, lab  work etc.   Clinical Course as of Aug 19 751  Thu Aug 18, 2017  0514 Pain much improved, reports 4 out of 10 now.   [MQ]    Clinical Course User Index [MQ] Sharyn CreamerQuale, Mark, MD    Patient reevaluated at 7:15 AM.  Reports pain down to about 410.  She is able to tolerate drinking 2 bottles of contrast, but does report it does cause pain in her upper abdomen after drinking them.  She currently does not wish for any additional pain medicine.  Ongoing care assigned to Dr. Mayford KnifeWilliams, follow-up on CT abdomen pelvis then reexamination.  ____________________________________________   FINAL CLINICAL IMPRESSION(S) / ED DIAGNOSES  Final diagnoses:  Left upper quadrant pain      NEW MEDICATIONS STARTED DURING THIS VISIT:  New Prescriptions   ONDANSETRON (ZOFRAN ODT) 4 MG DISINTEGRATING TABLET    Take 1 tablet (4 mg total) by mouth every 6 (six) hours as needed for nausea or vomiting.     Note:  This document was prepared using Dragon voice recognition software and may include unintentional dictation errors.     Sharyn CreamerQuale, Mark, MD 08/18/17 [phone]

## 2018-06-11 IMAGING — CT CT ABD-PELV W/ CM
2 of 5 series · 15 of 46 positions shown, 17 images · IV contrast (APPLIED)
Comparison: Abdominal radiographs from earlier today.

CLINICAL DATA: Left upper quadrant abdominal pain since yesterday.
Remote history of gastric bypass surgery. Cholecystectomy.
Hysterectomy.

EXAM:
CT ABDOMEN AND PELVIS WITH CONTRAST
TECHNIQUE: Multidetector CT imaging of the abdomen and pelvis was performed
using the standard protocol following bolus administration of
intravenous contrast.
CONTRAST:  100mL OMNIPAQUE IOHEXOL 300 MG/ML  SOLN

[Series 2: routine abd/pel with · axial · 0.77mm/px · z∈[-1040,-560]mm · 12 of 108 slices shown, 14 images]
[im 6/108  soft-tissue]
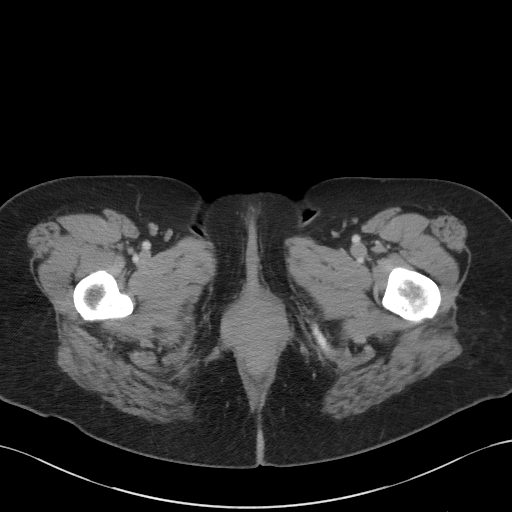
[im 6/108  bone]
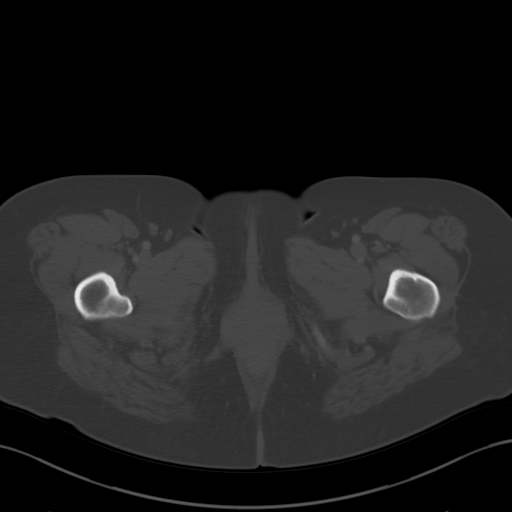
[im 16/108  soft-tissue]
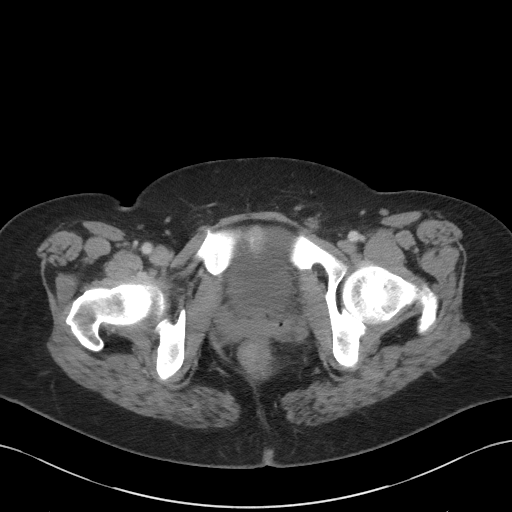
[im 26/108  soft-tissue]
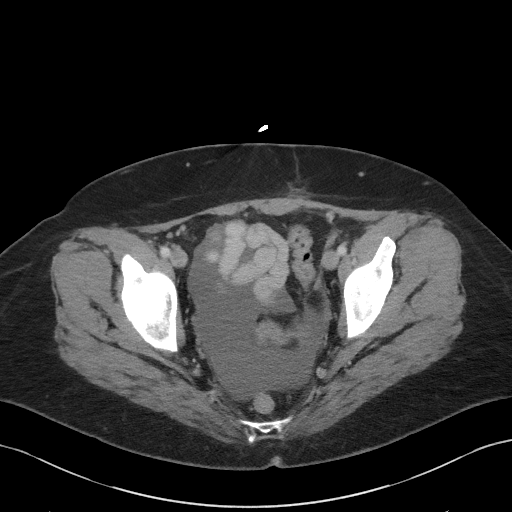
[im 31/108  soft-tissue]
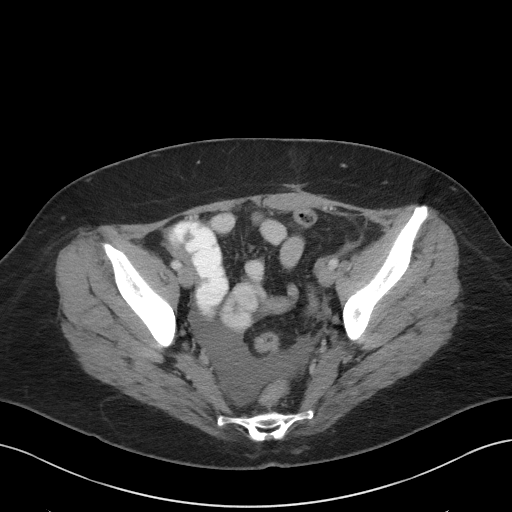
[im 41/108  soft-tissue]
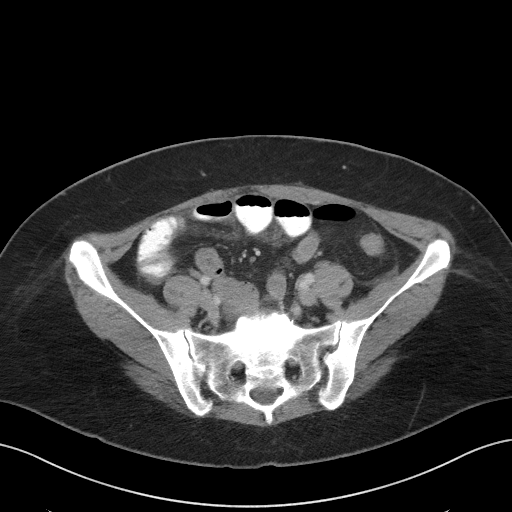
[im 51/108  soft-tissue]
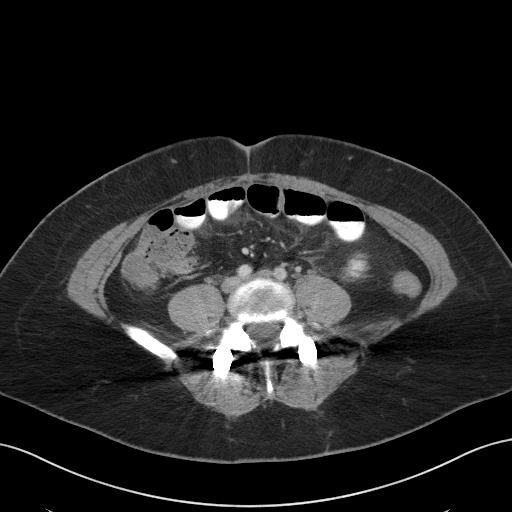
[im 57/108  soft-tissue]
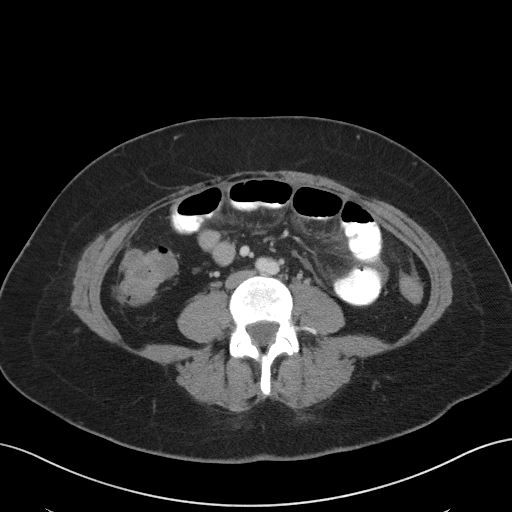
[im 67/108  soft-tissue]
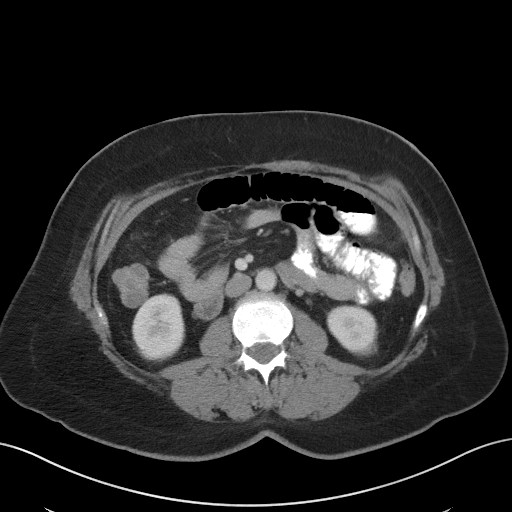
[im 77/108  soft-tissue]
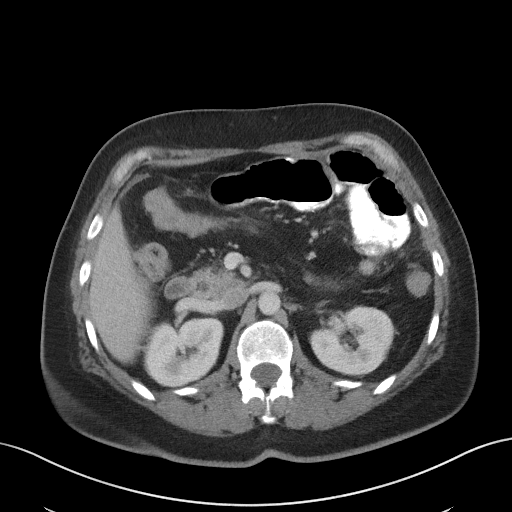
[im 77/108  bone]
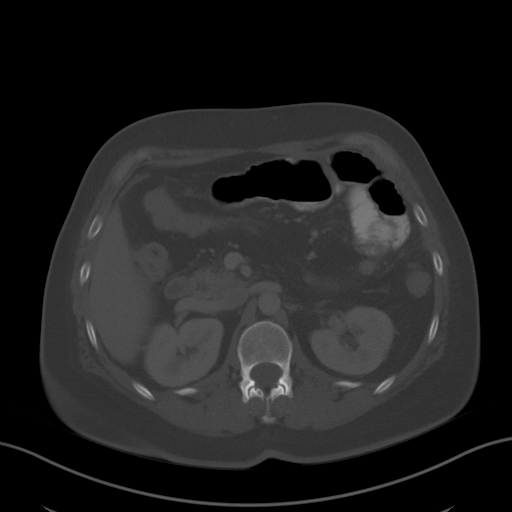
[im 82/108  soft-tissue]
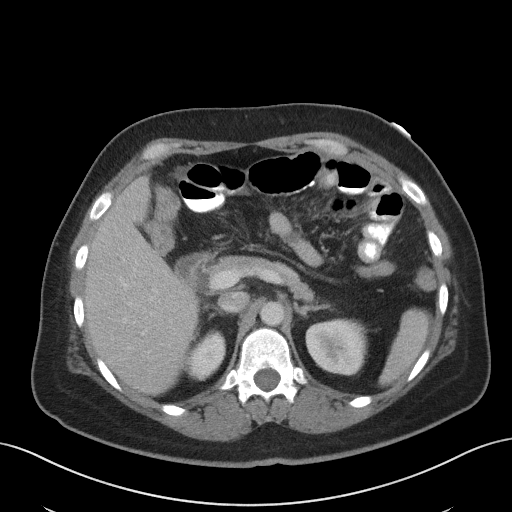
[im 92/108  soft-tissue]
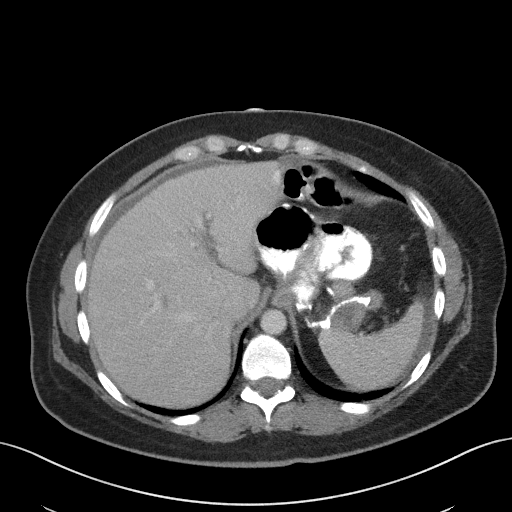
[im 102/108  soft-tissue]
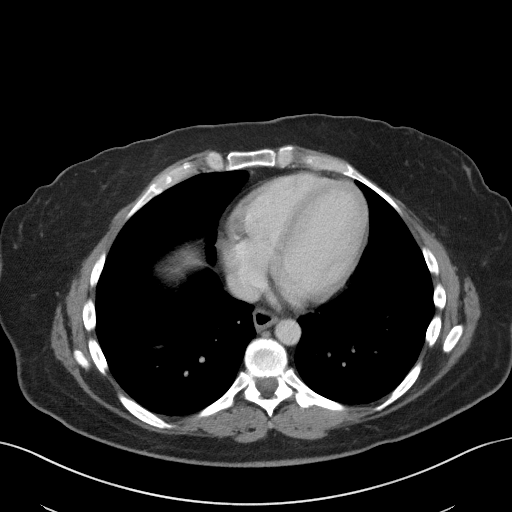

[Series 6: coronal st · coronal · 0.81mm/px · 3 of 88 slices shown]
[im 30/88  soft-tissue]
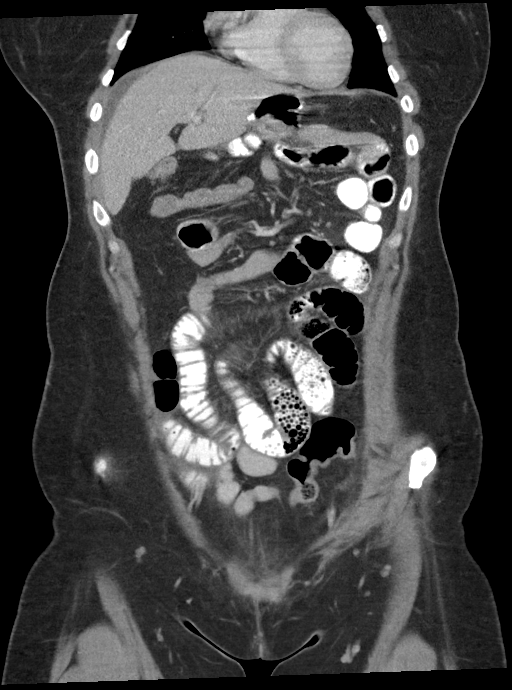
[im 39/88  soft-tissue]
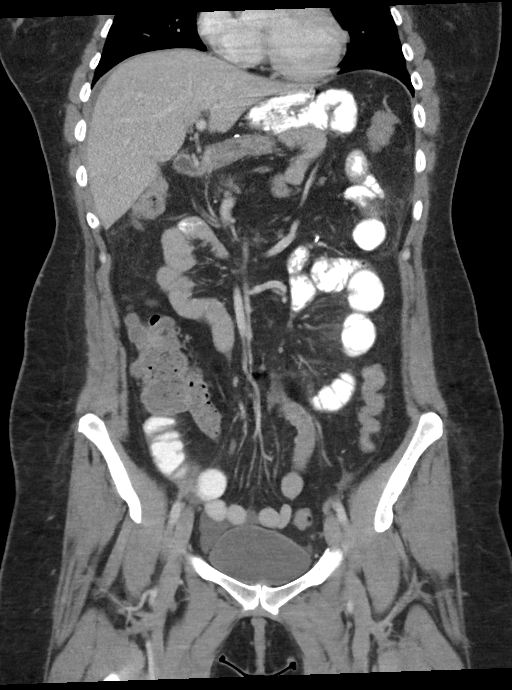
[im 49/88  soft-tissue]
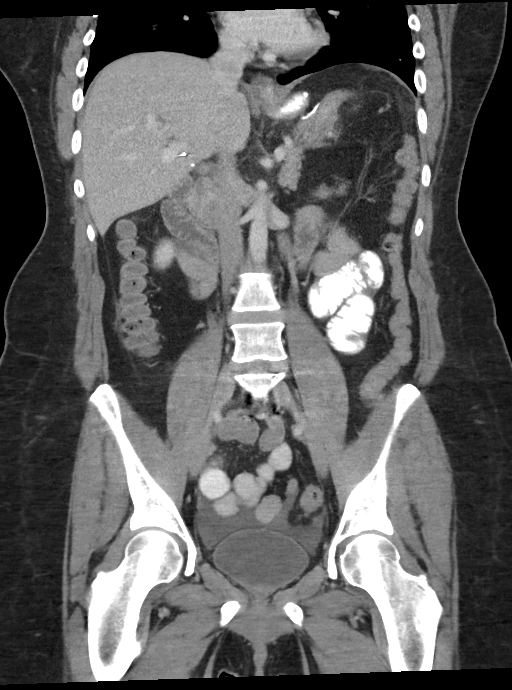

[15 of 46 positions shown; findings below may reference images not displayed]

FINDINGS: Lower chest: No significant pulmonary nodules or acute consolidative
airspace disease.

Hepatobiliary: Normal liver size. No liver mass. Cholecystectomy.
Bile ducts are within normal post cholecystectomy limits. CBD
diameter 8 mm.

Pancreas: Normal, with no mass or duct dilation.

Spleen: Normal size. No mass.

Adrenals/Urinary Tract: Normal adrenals. Subcentimeter hypodense
renal cortical lesions in the upper and lower right kidney are too
small to characterize and require no follow-up. Otherwise normal
kidneys, with no hydronephrosis. Normal bladder.

Stomach/Bowel: Small hiatal hernia. Patient is status post gastric
bypass surgery with intact appearing gastrojejunostomy and collapsed
excluded distal stomach. No gastric wall thickening. There is
expected dilatation at the enteroenterostomy in the upper abdomen.
There are several mildly dilated small bowel loops with air-fluid
levels throughout the abdomen measuring up to 3.4 cm diameter. No
discrete small bowel caliber transition. Oral contrast transits to
the distal small bowel. No small bowel wall thickening. Normal
appendix. Normal large bowel with no diverticulosis, large bowel
wall thickening or pericolonic fat stranding.

Vascular/Lymphatic: Atherosclerotic nonaneurysmal abdominal aorta.
Patent portal, splenic, hepatic and renal veins. No pathologically
enlarged lymph nodes in the abdomen or pelvis.

Reproductive: Status post hysterectomy, with no abnormal findings at
the vaginal cuff. No adnexal mass.

Other: No pneumoperitoneum. Small volume simple ascites. No focal
fluid collection.

Musculoskeletal: No aggressive appearing focal osseous lesions.
Surgical hardware from bilateral posterior spinal fusion at L5-S1
with bone cage in the L5-S1 disc space.
IMPRESSION: 1. Several mildly dilated small bowel loops with air-fluid levels
throughout the abdomen. Oral contrast transits to the distal small
bowel. No focal small bowel caliber transition. No bowel wall
thickening. Findings are favored to represent a mild adynamic ileus.
A resolving partial small bowel obstruction could have a similar
appearance.
2. Small hiatal hernia. Otherwise uncomplicated postsurgical changes
from gastric bypass surgery.
3. Small volume simple ascites.
4.  Aortic Atherosclerosis ([id number]).
